# Patient Record
Sex: Female | Born: 1950 | Hispanic: No | Smoking: Never smoker
Health system: Southern US, Community
[De-identification: ages and names within clinical notes are randomized; demographics above are authoritative.]

## PROBLEM LIST (undated history)

## (undated) ENCOUNTER — Ambulatory Visit: Admission: EM | Source: Home / Self Care

## (undated) HISTORY — PX: ABDOMINAL HYSTERECTOMY: SHX81

---

## 2017-05-29 ENCOUNTER — Ambulatory Visit
Admission: RE | Admit: 2017-05-29 | Discharge: 2017-05-29 | Disposition: A | Discharge status: Home / Self Care | Payer: Medicare Other | Source: Ambulatory Visit | Attending: Specialist | Admitting: Specialist

## 2017-05-29 ENCOUNTER — Other Ambulatory Visit: Payer: Self-pay | Admitting: Specialist

## 2017-05-29 DIAGNOSIS — M25562 Pain in left knee: Secondary | ICD-10-CM | POA: Insufficient documentation

## 2017-05-29 DIAGNOSIS — M1712 Unilateral primary osteoarthritis, left knee: Secondary | ICD-10-CM

## 2017-11-16 ENCOUNTER — Other Ambulatory Visit: Payer: Self-pay | Admitting: Specialist

## 2017-11-16 DIAGNOSIS — Z1231 Encounter for screening mammogram for malignant neoplasm of breast: Secondary | ICD-10-CM

## 2018-02-14 ENCOUNTER — Encounter: Payer: Self-pay | Admitting: Radiology

## 2018-02-14 ENCOUNTER — Ambulatory Visit
Admission: RE | Admit: 2018-02-14 | Discharge: 2018-02-14 | Disposition: A | Discharge status: Home / Self Care | Payer: Medicare Other | Source: Ambulatory Visit | Attending: Specialist | Admitting: Specialist

## 2018-02-14 DIAGNOSIS — Z1231 Encounter for screening mammogram for malignant neoplasm of breast: Secondary | ICD-10-CM

## 2019-05-23 ENCOUNTER — Other Ambulatory Visit: Payer: Self-pay | Admitting: Specialist

## 2019-05-23 DIAGNOSIS — Z1231 Encounter for screening mammogram for malignant neoplasm of breast: Secondary | ICD-10-CM

## 2019-06-05 ENCOUNTER — Other Ambulatory Visit: Payer: Self-pay

## 2019-06-05 ENCOUNTER — Ambulatory Visit
Admission: RE | Admit: 2019-06-05 | Discharge: 2019-06-05 | Disposition: A | Discharge status: Home / Self Care | Payer: Medicare Other | Source: Ambulatory Visit | Attending: Specialist | Admitting: Specialist

## 2019-06-05 ENCOUNTER — Ambulatory Visit: Payer: Medicare Other

## 2019-06-05 ENCOUNTER — Encounter (INDEPENDENT_AMBULATORY_CARE_PROVIDER_SITE_OTHER): Payer: Self-pay

## 2019-06-05 DIAGNOSIS — Z1231 Encounter for screening mammogram for malignant neoplasm of breast: Secondary | ICD-10-CM | POA: Diagnosis present

## 2019-06-11 ENCOUNTER — Ambulatory Visit: Payer: Medicare Other

## 2020-06-15 ENCOUNTER — Other Ambulatory Visit: Payer: Self-pay | Admitting: Specialist

## 2020-06-15 DIAGNOSIS — Z1231 Encounter for screening mammogram for malignant neoplasm of breast: Secondary | ICD-10-CM

## 2020-07-20 ENCOUNTER — Encounter (INDEPENDENT_AMBULATORY_CARE_PROVIDER_SITE_OTHER): Payer: Self-pay

## 2020-07-20 ENCOUNTER — Other Ambulatory Visit: Payer: Self-pay

## 2020-07-20 ENCOUNTER — Ambulatory Visit
Admission: RE | Admit: 2020-07-20 | Discharge: 2020-07-20 | Disposition: A | Discharge status: Home / Self Care | Payer: Medicare Other | Source: Ambulatory Visit | Attending: Specialist | Admitting: Specialist

## 2020-07-20 DIAGNOSIS — Z1231 Encounter for screening mammogram for malignant neoplasm of breast: Secondary | ICD-10-CM

## 2020-12-20 ENCOUNTER — Other Ambulatory Visit: Payer: Self-pay | Admitting: Specialist

## 2020-12-20 DIAGNOSIS — M8589 Other specified disorders of bone density and structure, multiple sites: Secondary | ICD-10-CM

## 2021-01-04 ENCOUNTER — Other Ambulatory Visit: Payer: Self-pay

## 2021-01-04 ENCOUNTER — Ambulatory Visit
Admission: RE | Admit: 2021-01-04 | Discharge: 2021-01-04 | Disposition: A | Discharge status: Home / Self Care | Payer: Medicare Other | Source: Ambulatory Visit | Attending: Specialist | Admitting: Specialist

## 2021-01-04 DIAGNOSIS — M8589 Other specified disorders of bone density and structure, multiple sites: Secondary | ICD-10-CM | POA: Insufficient documentation

## 2021-06-07 ENCOUNTER — Other Ambulatory Visit: Payer: Self-pay | Admitting: Specialist

## 2021-06-07 DIAGNOSIS — Z1231 Encounter for screening mammogram for malignant neoplasm of breast: Secondary | ICD-10-CM

## 2021-07-26 ENCOUNTER — Other Ambulatory Visit: Payer: Self-pay

## 2021-07-26 ENCOUNTER — Ambulatory Visit
Admission: RE | Admit: 2021-07-26 | Discharge: 2021-07-26 | Disposition: A | Discharge status: Home / Self Care | Payer: Medicare Other | Source: Ambulatory Visit | Attending: Specialist | Admitting: Specialist

## 2021-07-26 DIAGNOSIS — Z1231 Encounter for screening mammogram for malignant neoplasm of breast: Secondary | ICD-10-CM | POA: Insufficient documentation

## 2022-05-22 ENCOUNTER — Encounter: Payer: Self-pay | Admitting: Internal Medicine

## 2022-05-22 ENCOUNTER — Ambulatory Visit
Admission: EM | Admit: 2022-05-22 | Discharge: 2022-05-22 | Disposition: A | Discharge status: Home / Self Care | Payer: Medicare Other | Attending: Internal Medicine | Admitting: Internal Medicine

## 2022-05-22 DIAGNOSIS — Z20822 Contact with and (suspected) exposure to covid-19: Secondary | ICD-10-CM | POA: Diagnosis not present

## 2022-05-22 DIAGNOSIS — J069 Acute upper respiratory infection, unspecified: Secondary | ICD-10-CM | POA: Insufficient documentation

## 2022-05-22 LAB — RESP PANEL BY RT-PCR (FLU A&B, COVID) ARPGX2
Influenza A by PCR: NEGATIVE
Influenza B by PCR: NEGATIVE
SARS Coronavirus 2 by RT PCR: NEGATIVE

## 2022-05-22 NOTE — ED Provider Notes (Signed)
[facility]    CSN: [id number] Arrival date & time: 05/22/22  5916      History   Chief Complaint Chief Complaint  Patient presents with   Cough   Chills    HPI Sara Gallegos is a 71 y.o. female who presents with productive cough, chills and fatigue x 4 days. Would like a covid test. She states as the days go on, she gets better and better. Denies change in her appetite, and the fatigue is not as bad as when she initially got sick. Denies body aches or HA. Denies nose symptoms.     History reviewed. No pertinent past medical history.  There are no problems to display for this patient.   Past Surgical History:  Procedure Laterality Date   ABDOMINAL HYSTERECTOMY      OB History   No obstetric history on file.      Home Medications    Prior to Admission medications   Medication Sig Start Date End Date Taking? Authorizing Provider  hydroxychloroquine (PLAQUENIL) 200 MG tablet Take by mouth. 03/09/17  Yes [provider]  lisinopril (ZESTRIL) 10 MG tablet Take 1 tablet by mouth daily. 03/16/22 03/16/23 Yes [provider]  ascorbic acid (VITAMIN C) 500 MG tablet Take by mouth.    [provider]  aspirin EC 81 MG tablet Take by mouth.    [provider]  Cholecalciferol 50 MCG (2000 UT) CAPS Take by mouth.    [provider]  Garlic 1000 MG CAPS Take 1 capsule by mouth daily.    [provider]  Multiple Vitamin (MULTI-VITAMINS) TABS Take 2 tablets by mouth daily.    [provider]  Omega-3 1000 MG CAPS Take by mouth.    [provider]  Turmeric 400 MG CAPS Take by mouth.    [provider]    Family History Family History  Problem Relation Age of Onset   Breast cancer Mother 67    Social History Social History   Tobacco Use   Smoking status: Never   Smokeless tobacco: Never  Vaping Use   Vaping Use: Never used  Substance Use Topics   Alcohol use: Not Currently      Allergies   Patient has no known allergies.   Review of Systems Review of Systems  Constitutional:  Positive for chills. Negative for activity change, appetite change and fever.  HENT:  Negative for congestion, ear discharge, ear pain, postnasal drip, rhinorrhea, sore throat and trouble swallowing.   Eyes:  Negative for discharge.  Respiratory:  Positive for cough. Negative for shortness of breath.      Physical Exam Triage Vital Signs ED Triage Vitals [05/22/22 0957]  Enc Vitals Group     BP      Pulse      Resp      Temp      Temp src      SpO2      Weight      Height      Head Circumference      Peak Flow      Pain Score 0     Pain Loc      Pain Edu?      Excl. in [place]?    No data found.  Updated Vital Signs BP (!) 140/78 (BP Location: Left Arm)   Pulse 90   Temp 99.1 F (37.3 C) (Oral)   Resp 16   SpO2 100%   Visual Acuity  Right Eye Distance:   Left Eye Distance:   Bilateral Distance:    Right Eye Near:   Left Eye Near:    Bilateral Near:      Physical Exam Vitals signs and nursing note reviewed.  Constitutional:      General: She is not in acute distress.    Appearance: Normal appearance. She is not ill-appearing, toxic-appearing or diaphoretic.  HENT:     Head: Normocephalic.     Right Ear: Tympanic membrane, ear canal and external ear normal.     Left Ear: Tympanic membrane, ear canal and external ear normal.     Nose: Nose normal.     Mouth/Throat: clear    Mouth: Mucous membranes are moist.  Eyes:     General: No scleral icterus.       Right eye: No discharge.        Left eye: No discharge.     Conjunctiva/sclera: Conjunctivae normal.  Neck:     Musculoskeletal: Neck supple. No neck rigidity.  Cardiovascular:     Rate and Rhythm: Normal rate and regular rhythm.     Heart sounds: No murmur.  Pulmonary:     Effort: Pulmonary effort is normal.     Breath sounds: Normal breath sounds.  Musculoskeletal: Normal range of motion.   Lymphadenopathy:     Cervical: No cervical adenopathy.  Skin:    General: Skin is warm and dry.     Coloration: Skin is not jaundiced.     Findings: No rash.  Neurological:     Mental Status: She is alert and oriented to person, place, and time.     Gait: Gait normal.  Psychiatric:        Mood and Affect: Mood normal.        Behavior: Behavior normal.        Thought Content: Thought content normal.        Judgment: Judgment normal.    UC Treatments / Results  Labs (all labs ordered are listed, but only abnormal results are displayed) Labs Reviewed  RESP PANEL BY RT-PCR (FLU A&B, COVID) [id number]  Respiratory panel is negative  EKG   Radiology No results found.  Procedures Procedures (including critical care time)  Medications Ordered in UC Medications - No data to display  Initial Impression / Assessment and Plan / UC Course  I have reviewed the triage vital signs and the nursing notes.  URI  She declined any Tessalon. She will continue with Robitussin prn.   Final Clinical Impressions(s) / UC Diagnoses   Final diagnoses:  Upper respiratory tract infection, unspecified type     Discharge Instructions      If you dong have a temp of 100.5 or more, you may return to work You just have a cold     ED Prescriptions   None    PDMP not reviewed this encounter.   Garey Ham, PA-C 05/22/22 1205

## 2022-05-22 NOTE — Discharge Instructions (Addendum)
If you dong have a temp of 100.5 or more, you may return to work You just have a cold

## 2022-05-22 NOTE — ED Triage Notes (Addendum)
Pt presents with productive cough, chills, fatigue x 4 days. Pt states she's feeling better today, but still has a lot of fluid.

## 2022-06-12 ENCOUNTER — Ambulatory Visit
Admission: EM | Admit: 2022-06-12 | Discharge: 2022-06-12 | Disposition: A | Discharge status: Home / Self Care | Payer: Medicare Other | Attending: Emergency Medicine | Admitting: Emergency Medicine

## 2022-06-12 ENCOUNTER — Encounter: Payer: Self-pay | Admitting: Emergency Medicine

## 2022-06-12 DIAGNOSIS — S0001XA Abrasion of scalp, initial encounter: Secondary | ICD-10-CM | POA: Diagnosis not present

## 2022-06-12 DIAGNOSIS — Z23 Encounter for immunization: Secondary | ICD-10-CM

## 2022-06-12 MED ORDER — TETANUS-DIPHTHERIA TOXOIDS TD 5-2 LFU IM INJ
0.5000 mL | INJECTION | Freq: Once | INTRAMUSCULAR | Status: AC
  Administered 2022-06-12: 0.5 mL via INTRAMUSCULAR

## 2022-06-12 NOTE — ED Triage Notes (Signed)
Pt was at work and bent over to pick up an item and when she stood up she hit her head on a metal hook. She has some dried blood at the site no active bleeding. Pt denies loss of consciousness. Pt currently denies any pain.

## 2022-06-12 NOTE — ED Provider Notes (Signed)
[facility]    CSN: [id number] Arrival date & time: 06/12/22  1255      History   Chief Complaint Chief Complaint  Patient presents with   Head Injury    HPI Sara Gallegos is a 71 y.o. female.   HPI  81 old female here for evaluation of head injury.  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  She noticed that she had some bleeding and was instructed to come over for evaluation by her supervisor.  She denies any loss of consciousness, dizziness, nausea, vomiting, or changes in vision.  History reviewed. No pertinent past medical history.  There are no problems to display for this patient.   Past Surgical History:  Procedure Laterality Date   ABDOMINAL HYSTERECTOMY      OB History   No obstetric history on file.      Home Medications    Prior to Admission medications   Medication Sig Start Date End Date Taking? Authorizing Provider  ascorbic acid (VITAMIN C) 500 MG tablet Take by mouth.    [provider]  aspirin EC 81 MG tablet Take by mouth.    [provider]  Cholecalciferol 50 MCG (2000 UT) CAPS Take by mouth.    [provider]  Garlic 123XX123 MG CAPS Take 1 capsule by mouth daily.    [provider]  hydroxychloroquine (PLAQUENIL) 200 MG tablet Take by mouth. 03/09/17   [provider]  lisinopril (ZESTRIL) 10 MG tablet Take 1 tablet by mouth daily. 03/16/22 03/16/23  [provider]  Multiple Vitamin (MULTI-VITAMINS) TABS Take 2 tablets by mouth daily.    [provider]  Omega-3 1000 MG CAPS Take by mouth.    [provider]  Turmeric 400 MG CAPS Take by mouth.    [provider]    Family History Family History  Problem Relation Age of Onset   Breast cancer Mother 28    Social History Social History    Tobacco Use   Smoking status: Never   Smokeless tobacco: Never  Vaping Use   Vaping Use: Never used  Substance Use Topics   Alcohol use: Not Currently     Allergies   Patient has no known allergies.   Review of Systems Review of Systems  Eyes:  Negative for visual disturbance.  Gastrointestinal:  Negative for nausea and vomiting.  Skin:  Positive for wound.  Neurological:  Negative for dizziness and headaches.  Hematological: Negative.   Psychiatric/Behavioral: Negative.       Physical Exam Triage Vital Signs ED Triage Vitals  Enc Vitals Group     BP 06/12/22 1349 (!) 155/84     Pulse Rate 06/12/22 1349 78     Resp 06/12/22 1349 16     Temp 06/12/22 1349 98.2 F (36.8 C)     Temp Source 06/12/22 1349 Oral     SpO2 06/12/22 1349 95 %     Weight --      Height --      Head Circumference --      Peak Flow --      Pain Score 06/12/22 1344 0     Pain Loc --      Pain Edu? --      Excl. in [place]? --  No data found.  Updated Vital Signs BP (!) 155/84   Pulse 78   Temp 98.2 F (36.8 C) (Oral)   Resp 16   SpO2 95%   Visual Acuity Right Eye Distance:   Left Eye Distance:   Bilateral Distance:    Right Eye Near:   Left Eye Near:    Bilateral Near:     Physical Exam Vitals and nursing note reviewed.  Constitutional:      Appearance: Normal appearance. She is not ill-appearing.  HENT:     Head: Normocephalic.     Comments: Patient has a small, 1 cm, abrasion to the right occipital parietal area of her scalp.  No active bleeding.  No ecchymosis, erythema, edema, or tenderness noted. Eyes:     Extraocular Movements: Extraocular movements intact.     Pupils: Pupils are equal, round, and reactive to light.  Skin:    General: Skin is warm and dry.     Capillary Refill: Capillary refill takes less than 2 seconds.  Neurological:     General: No focal deficit present.     Mental Status: She is alert and oriented to person, place, and time.  Psychiatric:         Mood and Affect: Mood normal.        Behavior: Behavior normal.        Thought Content: Thought content normal.        Judgment: Judgment normal.       UC Treatments / Results  Labs (all labs ordered are listed, but only abnormal results are displayed) Labs Reviewed - No data to display  EKG   Radiology No results found.  Procedures Procedures (including critical care time)  Medications Ordered in UC Medications  tetanus & diphtheria toxoids (adult) (TENIVAC) injection 0.5 mL (has no administration in time range)    Initial Impression / Assessment and Plan / UC Course  I have reviewed the triage vital signs and the nursing notes.  Pertinent labs & imaging results that were available during my care of the patient were reviewed by me and considered in my medical decision making (see chart for details).   Patient is a pleasant, nontoxic-appearing 71 year old female here for evaluation after sustaining a head injury while working at [employer].  She did not have a loss of consciousness but she did strike her head on one of the hooks in a fitting room and noticed some bleeding.  She was sent over here for evaluation.  She is alert and oriented x3, pupils equal round reactive, EOMs intact, and patient is a full recall of events.  On exam patient has a very small, 1 cm scabbed abrasion on the right occipital parietal area of her scalp with no active bleeding.  There is no matted blood in the hair.  She has no tenderness, edema, erythema, ecchymosis, or crepitus with palpation of the surrounding tissues.  I will discharge patient home with a diagnosis of scalp abrasion.  She states that she is not sure when her last tetanus shot was so I will order a Td update prior to her discharge.  I do not feel that she needs to be on any antibiotics as the infection risk is low given that is a scalp wound.   Final Clinical Impressions(s) / UC Diagnoses   Final diagnoses:  Abrasion of scalp,  initial encounter     Discharge Instructions      Your exam today revealed the presence of a very  superficial scalp abrasion that has already scabbed over.  You may wash your hair as usual to keep the area clean and dry.  Do not vigorously scrub your scalp eyes do not dislodge the scab and cause bleeding.  You can take over-the-counter Tylenol and ibuprofen according to the package instructions as needed for pain.  Please return for reevaluation for any new or worsening symptoms.     ED Prescriptions   None    PDMP not reviewed this encounter.   Margarette Canada, NP 06/12/22 1406

## 2022-06-12 NOTE — Discharge Instructions (Addendum)
Your exam today revealed the presence of a very superficial scalp abrasion that has already scabbed over.  You may wash your hair as usual to keep the area clean and dry.  Do not vigorously scrub your scalp eyes do not dislodge the scab and cause bleeding.  You can take over-the-counter Tylenol and ibuprofen according to the package instructions as needed for pain.  Please return for reevaluation for any new or worsening symptoms.

## 2022-10-09 ENCOUNTER — Other Ambulatory Visit: Payer: Self-pay | Admitting: Specialist

## 2022-10-09 DIAGNOSIS — Z1231 Encounter for screening mammogram for malignant neoplasm of breast: Secondary | ICD-10-CM

## 2022-10-10 ENCOUNTER — Ambulatory Visit
Admission: RE | Admit: 2022-10-10 | Discharge: 2022-10-10 | Disposition: A | Discharge status: Home / Self Care | Payer: Medicare Other | Source: Ambulatory Visit | Attending: Specialist | Admitting: Specialist

## 2022-10-10 DIAGNOSIS — Z1231 Encounter for screening mammogram for malignant neoplasm of breast: Secondary | ICD-10-CM | POA: Insufficient documentation

## 2022-12-19 ENCOUNTER — Other Ambulatory Visit: Payer: Self-pay | Admitting: Specialist

## 2022-12-19 DIAGNOSIS — M8589 Other specified disorders of bone density and structure, multiple sites: Secondary | ICD-10-CM

## 2023-02-07 ENCOUNTER — Ambulatory Visit
Admission: RE | Admit: 2023-02-07 | Discharge: 2023-02-07 | Disposition: A | Discharge status: Home / Self Care | Payer: Medicare Other | Source: Ambulatory Visit | Attending: Specialist | Admitting: Specialist

## 2023-02-07 DIAGNOSIS — M8589 Other specified disorders of bone density and structure, multiple sites: Secondary | ICD-10-CM

## 2023-02-24 IMAGING — MG MM DIGITAL SCREENING BILAT W/ TOMO AND CAD
6 of 10 series · 6 of 30 positions shown · non-contrast
Comparison: Previous exam(s).

CLINICAL DATA: Screening.

EXAM:
DIGITAL SCREENING BILATERAL MAMMOGRAM WITH TOMOSYNTHESIS AND CAD
TECHNIQUE: Bilateral screening digital craniocaudal and mediolateral oblique
mammograms were obtained. Bilateral screening digital breast
tomosynthesis was performed. The images were evaluated with
computer-aided detection.

[R CC synth-2D (1 of 2)]
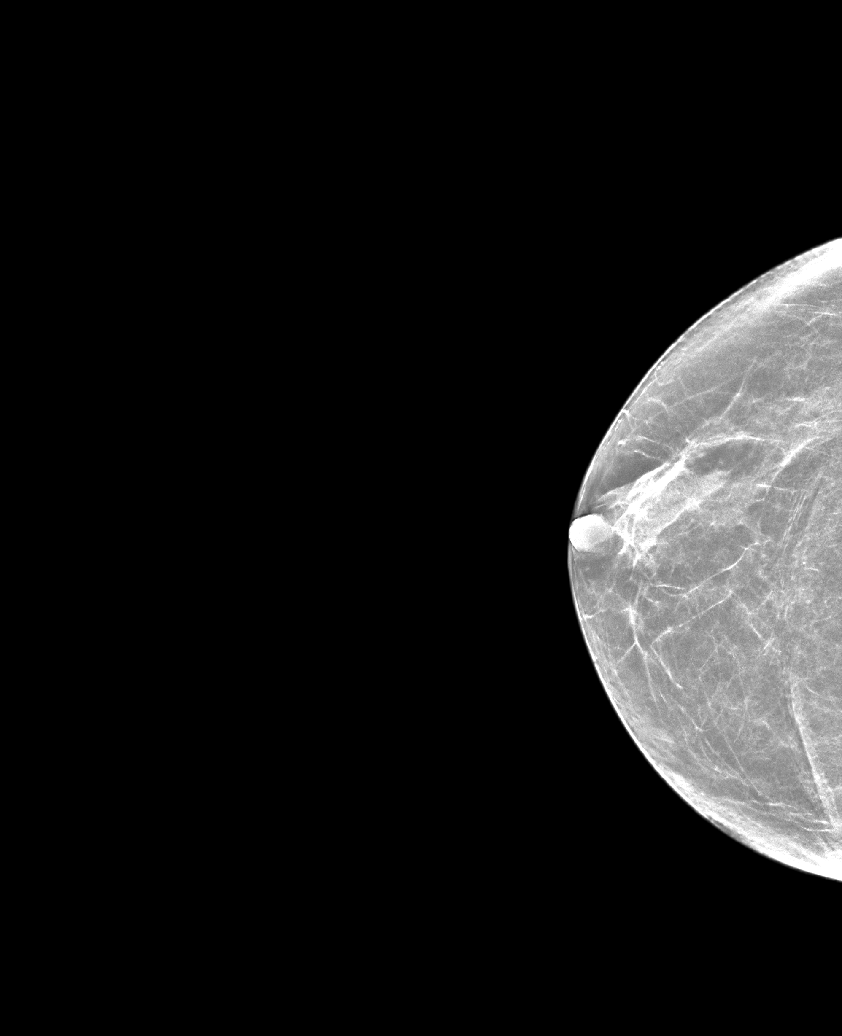

[L MLO synth-2D]
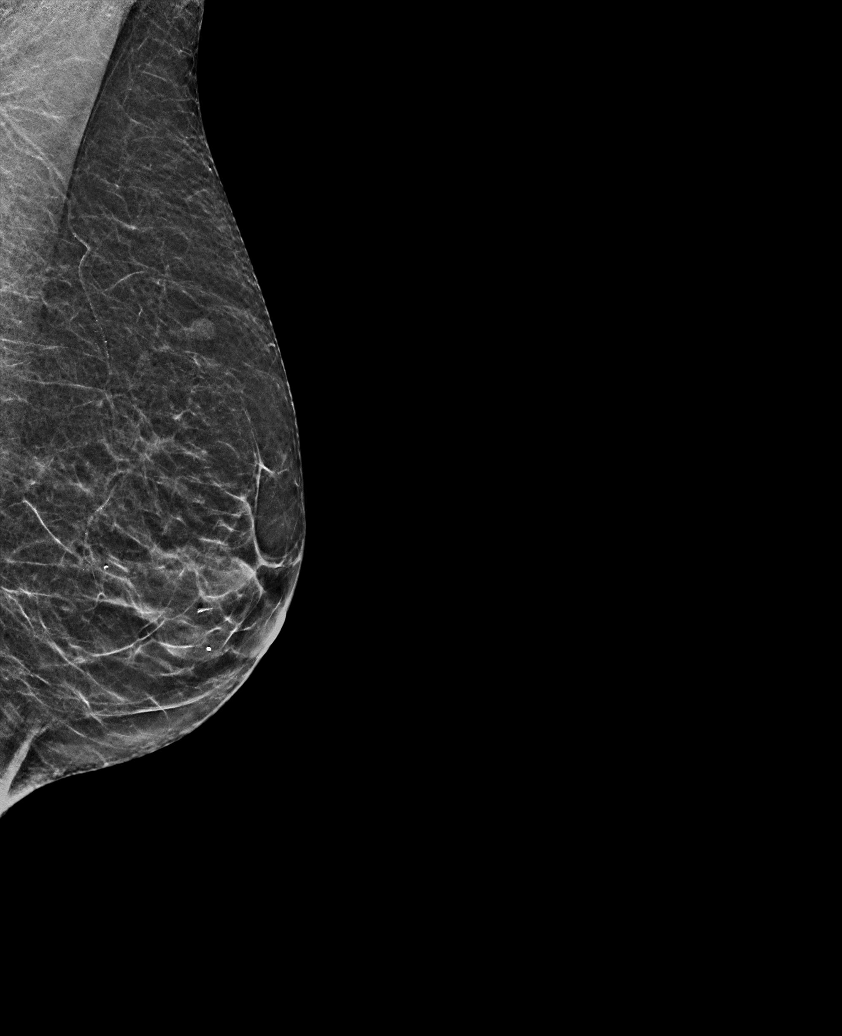

[R MLO synth-2D]
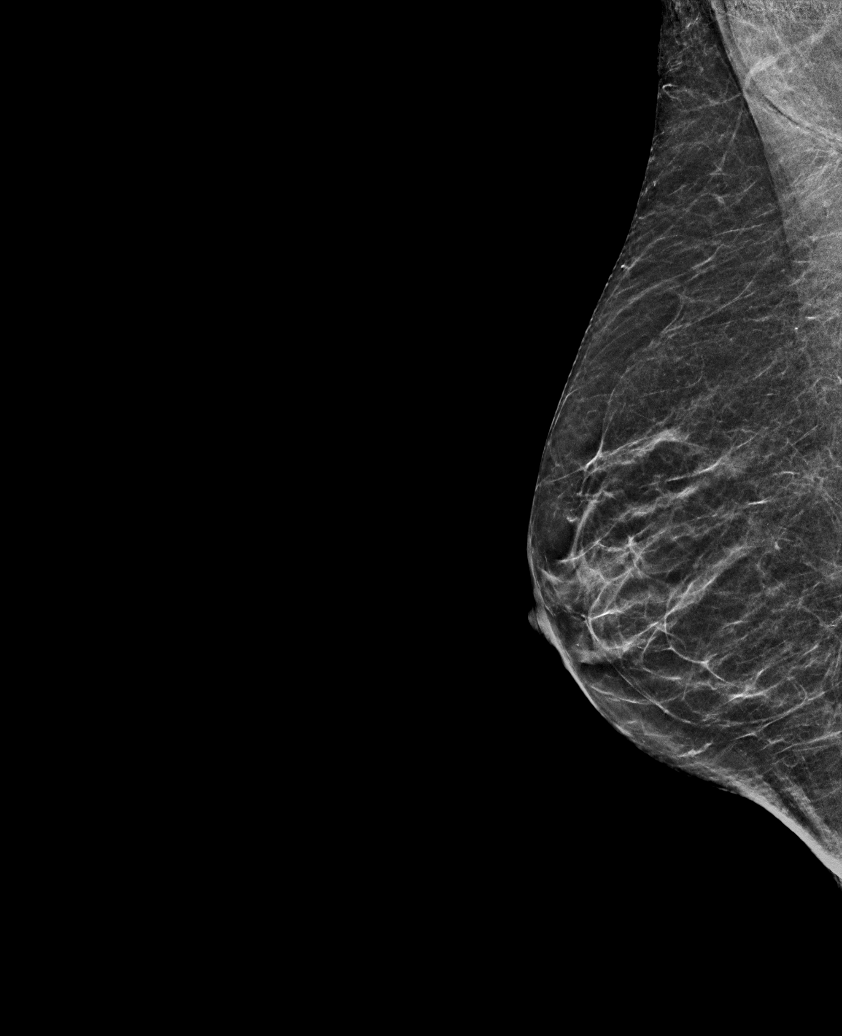

[L CC synth-2D]
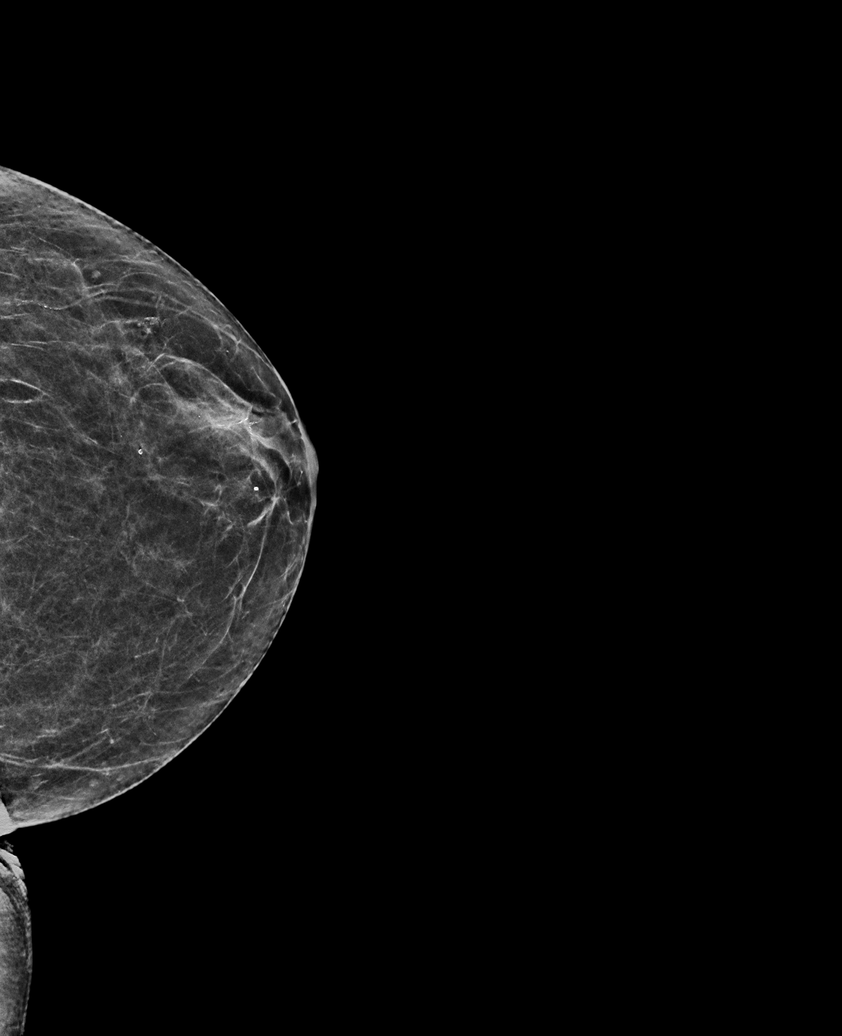

[R CC synth-2D (2 of 2)]
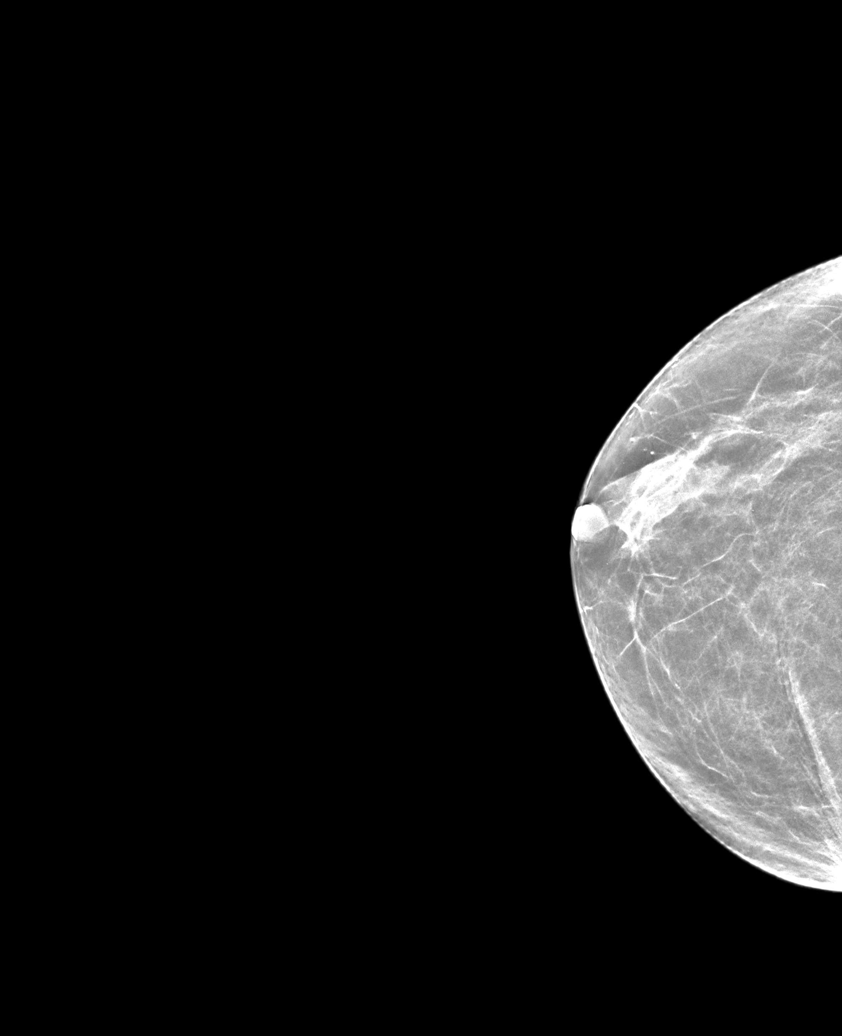

[L CC tomo · tomo slice 27/53.0]
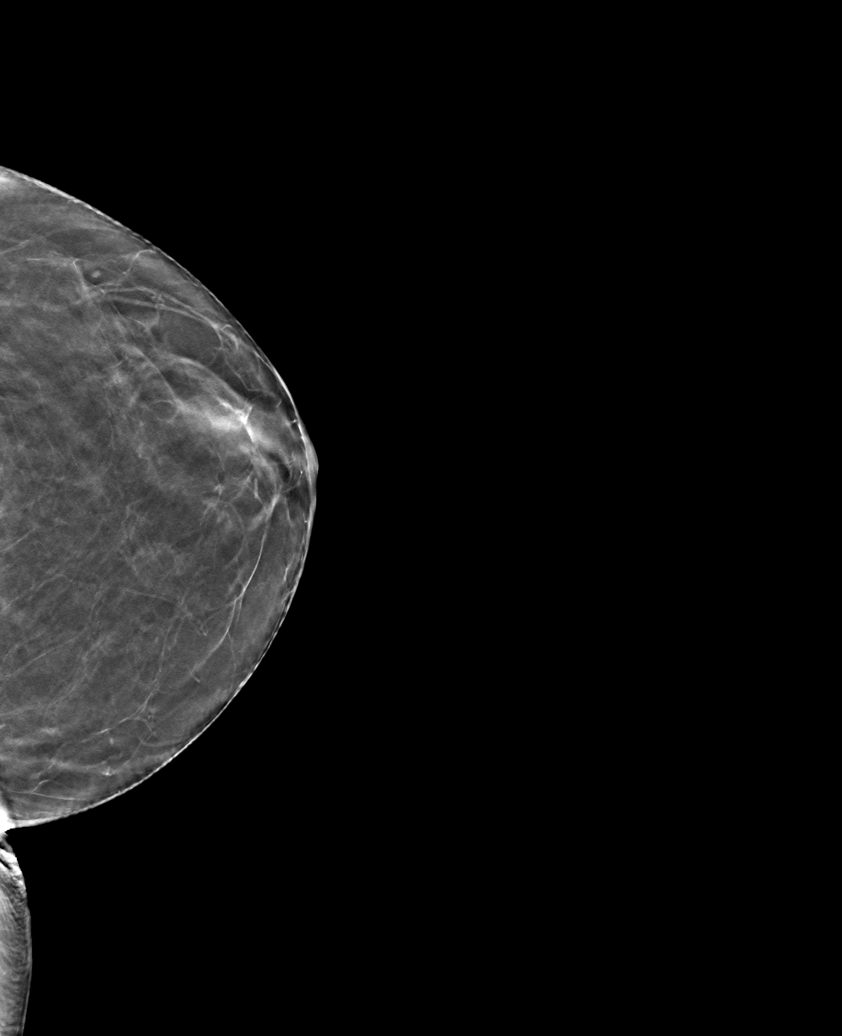

[6 of 30 positions shown; findings below may reference images not displayed]

ACR Breast Density Category b: There are scattered areas of
fibroglandular density.
FINDINGS: There are no findings suspicious for malignancy.
IMPRESSION: No mammographic evidence of malignancy. A result letter of this
screening mammogram will be mailed directly to the patient.

RECOMMENDATION:
Screening mammogram in one year. (Code:51-O-LD2)

BI-RADS CATEGORY  1: Negative.

## 2023-10-15 ENCOUNTER — Encounter: Payer: Self-pay | Admitting: Specialist

## 2023-10-15 ENCOUNTER — Other Ambulatory Visit: Payer: Self-pay | Admitting: Student

## 2023-10-15 ENCOUNTER — Other Ambulatory Visit: Payer: Self-pay | Admitting: Specialist

## 2023-10-15 DIAGNOSIS — Z1231 Encounter for screening mammogram for malignant neoplasm of breast: Secondary | ICD-10-CM

## 2023-10-24 ENCOUNTER — Inpatient Hospital Stay: Admission: RE | Admit: 2023-10-24 | Payer: Medicare Other | Source: Ambulatory Visit

## 2023-10-31 ENCOUNTER — Inpatient Hospital Stay: Admission: RE | Admit: 2023-10-31 | Payer: Medicare Other | Source: Ambulatory Visit

## 2023-11-07 ENCOUNTER — Ambulatory Visit
Admission: RE | Admit: 2023-11-07 | Discharge: 2023-11-07 | Disposition: A | Discharge status: Home / Self Care | Payer: Medicare Other | Source: Ambulatory Visit | Attending: Student | Admitting: Student

## 2023-11-07 DIAGNOSIS — Z1231 Encounter for screening mammogram for malignant neoplasm of breast: Secondary | ICD-10-CM | POA: Insufficient documentation

## 2024-01-14 ENCOUNTER — Emergency Department

## 2024-01-14 ENCOUNTER — Encounter: Payer: Self-pay | Admitting: Emergency Medicine

## 2024-01-14 ENCOUNTER — Other Ambulatory Visit: Payer: Self-pay

## 2024-01-14 ENCOUNTER — Emergency Department
Admission: EM | Admit: 2024-01-14 | Discharge: 2024-01-14 | Disposition: A | Discharge status: Home / Self Care | Attending: Emergency Medicine | Admitting: Emergency Medicine

## 2024-01-14 DIAGNOSIS — S42211A Unspecified displaced fracture of surgical neck of right humerus, initial encounter for closed fracture: Secondary | ICD-10-CM | POA: Insufficient documentation

## 2024-01-14 DIAGNOSIS — W010XXA Fall on same level from slipping, tripping and stumbling without subsequent striking against object, initial encounter: Secondary | ICD-10-CM | POA: Diagnosis not present

## 2024-01-14 DIAGNOSIS — Y92009 Unspecified place in unspecified non-institutional (private) residence as the place of occurrence of the external cause: Secondary | ICD-10-CM | POA: Insufficient documentation

## 2024-01-14 DIAGNOSIS — S4991XA Unspecified injury of right shoulder and upper arm, initial encounter: Secondary | ICD-10-CM | POA: Diagnosis present

## 2024-01-14 MED ORDER — OXYCODONE HCL 5 MG PO TABS
5.0000 mg | ORAL_TABLET | Freq: Once | ORAL | Status: AC
  Administered 2024-01-14: 5 mg via ORAL
  Filled 2024-01-14: qty 1

## 2024-01-14 MED ORDER — OXYCODONE HCL 5 MG PO TABS: 5.0000 mg | ORAL_TABLET | Freq: Three times a day (TID) | ORAL | 0 refills | Status: AC | PRN

## 2024-01-14 NOTE — ED Provider Notes (Signed)
 Shared visit   Complicated humerus fracture.  Dr. Lydia Sams with orthopedics recommended outpatient follow-up with a sling.  Discussed pain medication.  Neurovascularly intact.  Discharged home with her son.  Discussed return precautions.    Sara Ground, MD 01/14/24 [phone]

## 2024-01-14 NOTE — ED Provider Notes (Signed)
 [facility] Provider Note    Event Date/Time   First MD Initiated Contact with Patient 01/14/24 1106     (approximate)   History   Shoulder Injury   HPI  Sara Gallegos is a 73 y.o. female history of rheumatoid arthritis presents emergency department after a fall at home.  Patient tripped over a bed frame landing on the right side.  No head injury.  No neck pain.  Cannot move the arm.  No numbness or tingling.  No head injury or neck pain      Physical Exam   Triage Vital Signs: ED Triage Vitals  Encounter Vitals Group     BP 01/14/24 1038 (!) 152/68     Systolic BP Percentile --      Diastolic BP Percentile --      Pulse Rate 01/14/24 1038 73     Resp 01/14/24 1038 18     Temp 01/14/24 1038 97.6 F (36.4 C)     Temp Source 01/14/24 1038 Oral     SpO2 01/14/24 1038 100 %     Weight 01/14/24 1037 141 lb 12.8 oz (64.3 kg)     Height 01/14/24 1037 5\' 5"  (1.651 m)     Head Circumference --      Peak Flow --      Pain Score 01/14/24 1036 10     Pain Loc --      Pain Education --      Exclude from Growth Chart --     Most recent vital signs: Vitals:   01/14/24 1038  BP: (!) 152/68  Pulse: 73  Resp: 18  Temp: 97.6 F (36.4 C)  SpO2: 100%     General: Awake, no distress.   CV:  Good peripheral perfusion.  Resp:  Normal effort. Abd:  No distention.   Other:  Right shoulder with bulging anteriorly and inferiorly, patient cannot move the right shoulder without lifting the lower arm with the other hand.  Neurovascular is intact   ED Results / Procedures / Treatments   Labs (all labs ordered are listed, but only abnormal results are displayed) Labs Reviewed - No data to display   EKG     RADIOLOGY X-ray of the right shoulder and humerus    PROCEDURES:   Procedures Chief Complaint  Patient presents with   Shoulder Injury      MEDICATIONS ORDERED IN ED: Medications  oxyCODONE (Oxy IR/ROXICODONE) immediate  release tablet 5 mg (5 mg Oral Given 01/14/24 1156)     IMPRESSION / MDM / ASSESSMENT AND PLAN / ED COURSE  I reviewed the triage vital signs and the nursing notes.                              Differential diagnosis includes, but is not limited to, fracture, dislocation, subluxation, contusion, strain  Patient's presentation is most consistent with acute illness / injury with system symptoms.   X-ray of the right shoulder was independently reviewed interpreted by me as being subluxed, radiologist comments no frank dislocation however there is a humeral neck fracture that is anterior and inferiorly subluxed.  Consult to orthopedics,  Consult with Dr. Lydia Sams from orthopedics, placed patient in a sling, will follow-up in the office.  I did discuss the findings with the patient and her son.  She was given oxycodone while here in the ED.  Instructed her to take Tylenol for pain.  For pain that is not controlled by Tylenol she can take oxycodone.  If she is taking the oxycodone she needs to be taking a stool softener such as Senokot or MiraLAX.  She and her son are in agreement with treatment plan.  She was discharged stable condition.      FINAL CLINICAL IMPRESSION(S) / ED DIAGNOSES   Final diagnoses:  Closed displaced fracture of surgical neck of right humerus, unspecified fracture morphology, initial encounter     Rx / DC Orders   ED Discharge Orders          Ordered    oxyCODONE (ROXICODONE) 5 MG immediate release tablet  Every 8 hours PRN        01/14/24 1148             Note:  This document was prepared using Dragon voice recognition software and may include unintentional dictation errors.    Delsie Figures, PA-C 01/14/24 1554    Viviano Ground, MD 01/15/24 [phone]

## 2024-01-14 NOTE — Discharge Instructions (Signed)
 Follow-up with orthopedics. Wear the sling at all times Apply ice to the right shoulder Take Tylenol for pain, oxycodone for pain not relieved with Tylenol If you take oxycodone you will need to take a stool softener such as MiraLAX or Senokot so you do not get constipated Return emergency department if worsening

## 2024-01-14 NOTE — ED Notes (Signed)
 See triage note  Presents s/p fall  States she fell   hitting right shoulder  Was able to get herself up  Not able to move arm  Good pulses

## 2024-01-14 NOTE — ED Triage Notes (Signed)
 Pt arrived via POV with reports of tripping over bed frame on the floor, pt landed onto R shoulder and upper arm denies any head injury, pt c/o decreased ROM to R arm.
